# Patient Record
Sex: Female | Born: 2000 | Race: White | Hispanic: No | Marital: Single | Smoking: Current every day smoker
Health system: Southern US, Community
[De-identification: ages and names within clinical notes are randomized; demographics above are authoritative.]

## PROBLEM LIST (undated history)

## (undated) DIAGNOSIS — R569 Unspecified convulsions: Secondary | ICD-10-CM

## (undated) DIAGNOSIS — F32A Depression, unspecified: Secondary | ICD-10-CM

## (undated) DIAGNOSIS — K219 Gastro-esophageal reflux disease without esophagitis: Secondary | ICD-10-CM

## (undated) DIAGNOSIS — H6690 Otitis media, unspecified, unspecified ear: Secondary | ICD-10-CM

## (undated) DIAGNOSIS — F4310 Post-traumatic stress disorder, unspecified: Secondary | ICD-10-CM

## (undated) DIAGNOSIS — F909 Attention-deficit hyperactivity disorder, unspecified type: Secondary | ICD-10-CM

## (undated) DIAGNOSIS — E669 Obesity, unspecified: Secondary | ICD-10-CM

## (undated) DIAGNOSIS — F329 Major depressive disorder, single episode, unspecified: Secondary | ICD-10-CM

## (undated) DIAGNOSIS — F419 Anxiety disorder, unspecified: Secondary | ICD-10-CM

## (undated) HISTORY — PX: NO PAST SURGERIES: SHX2092

---
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
## 2021-03-25 DIAGNOSIS — F1990 Other psychoactive substance use, unspecified, uncomplicated: Secondary | ICD-10-CM | POA: Insufficient documentation

## 2021-03-27 ENCOUNTER — Ambulatory Visit: Payer: Self-pay

## 2021-09-30 ENCOUNTER — Telehealth: Payer: Self-pay

## 2021-10-05 NOTE — Telephone Encounter (Signed)
OUD Intake:  Drug of choice: Heroin  How long: 2.5 years  Other substances: No  Currently on suboxone: No  Interested in treatment: Yes; two years ago she was working with Psychiatrist to quit but then that Provider went out-of-network  Around users.: Occasionally  Social support: Yes, mom and grandparents   Other: NA  Will forward to OUD Attending Pool for consideration of OUD appt. Kinnie Feil, BSN, RN-BC

## 2021-10-05 NOTE — Telephone Encounter (Signed)
Sounds appropriate for OUD appointment

## 2021-10-05 NOTE — Telephone Encounter (Signed)
Have tried 3 different numbers to schedule patient for OUD appt. No answer and no VM set up.

## 2022-05-10 ENCOUNTER — Ambulatory Visit: Payer: Self-pay

## 2022-07-14 ENCOUNTER — Ambulatory Visit
Admission: EM | Admit: 2022-07-14 | Discharge: 2022-07-14 | Payer: Medicaid Other | Attending: Physician Assistant | Admitting: Physician Assistant

## 2022-07-14 ENCOUNTER — Emergency Department
Admission: EM | Admit: 2022-07-14 | Discharge: 2022-07-14 | Disposition: A | Discharge status: Home / Self Care | Payer: Medicaid Other | Attending: Emergency Medicine | Admitting: Emergency Medicine

## 2022-07-14 ENCOUNTER — Telehealth: Payer: Self-pay | Admitting: Physician Assistant

## 2022-07-14 ENCOUNTER — Emergency Department: Payer: Medicaid Other

## 2022-07-14 ENCOUNTER — Other Ambulatory Visit: Payer: Self-pay

## 2022-07-14 DIAGNOSIS — K529 Noninfective gastroenteritis and colitis, unspecified: Secondary | ICD-10-CM

## 2022-07-14 DIAGNOSIS — N3090 Cystitis, unspecified without hematuria: Secondary | ICD-10-CM | POA: Diagnosis not present

## 2022-07-14 DIAGNOSIS — R82998 Other abnormal findings in urine: Secondary | ICD-10-CM | POA: Insufficient documentation

## 2022-07-14 DIAGNOSIS — R1011 Right upper quadrant pain: Secondary | ICD-10-CM

## 2022-07-14 DIAGNOSIS — N390 Urinary tract infection, site not specified: Secondary | ICD-10-CM

## 2022-07-14 DIAGNOSIS — A599 Trichomoniasis, unspecified: Secondary | ICD-10-CM

## 2022-07-14 LAB — URINALYSIS, ROUTINE W REFLEX MICROSCOPIC
Bilirubin Urine: NEGATIVE
Glucose, UA: NEGATIVE mg/dL
Hgb urine dipstick: NEGATIVE
Ketones, ur: NEGATIVE mg/dL
Nitrite: NEGATIVE
Protein, ur: >=300 mg/dL — AB
Specific Gravity, Urine: 1.043 — ABNORMAL HIGH (ref 1.005–1.030)
WBC, UA: >50 WBC/hpf (ref 0–5)
pH: 5 (ref 5.0–8.0)

## 2022-07-14 LAB — COMPREHENSIVE METABOLIC PANEL
ALT: 13 U/L (ref 0–44)
AST: 17 U/L (ref 15–41)
Albumin: 3.8 g/dL (ref 3.5–5.0)
Alkaline Phosphatase: 99 U/L (ref 38–126)
Anion gap: 11 (ref 5–15)
BUN: 15 mg/dL (ref 6–20)
CO2: 27 mmol/L (ref 22–32)
Calcium: 9.6 mg/dL (ref 8.9–10.3)
Chloride: 99 mmol/L (ref 98–111)
Creatinine, Ser: 0.72 mg/dL (ref 0.44–1.00)
GFR, Estimated: >60 mL/min (ref >60)
Glucose, Bld: 128 mg/dL — ABNORMAL HIGH (ref 70–99)
Potassium: 3.8 mmol/L (ref 3.5–5.1)
Sodium: 137 mmol/L (ref 135–145)
Total Bilirubin: 0.6 mg/dL (ref 0.3–1.2)
Total Protein: 8.9 g/dL — ABNORMAL HIGH (ref 6.5–8.1)

## 2022-07-14 LAB — URINALYSIS, W/ REFLEX TO CULTURE (INFECTION SUSPECTED)
Glucose, UA: NEGATIVE mg/dL
Hgb urine dipstick: NEGATIVE
Ketones, ur: 15 mg/dL — AB
Leukocytes,Ua: NEGATIVE
Nitrite: POSITIVE — AB
Protein, ur: 100 mg/dL — AB
Specific Gravity, Urine: >1.03 — ABNORMAL HIGH (ref 1.005–1.030)
pH: 6 (ref 5.0–8.0)

## 2022-07-14 LAB — CBC
HCT: 37.8 % (ref 36.0–46.0)
Hemoglobin: 12.4 g/dL (ref 12.0–15.0)
MCH: 27.5 pg (ref 26.0–34.0)
MCHC: 32.8 g/dL (ref 30.0–36.0)
MCV: 83.8 fL (ref 80.0–100.0)
Platelets: 259 10*3/uL (ref 150–400)
RBC: 4.51 MIL/uL (ref 3.87–5.11)
RDW: 13.5 % (ref 11.5–15.5)
WBC: 23 10*3/uL — ABNORMAL HIGH (ref 4.0–10.5)
nRBC: 0 % (ref 0.0–0.2)

## 2022-07-14 LAB — POC URINE PREG, ED: Preg Test, Ur: NEGATIVE

## 2022-07-14 LAB — LIPASE, BLOOD: Lipase: 32 U/L (ref 11–51)

## 2022-07-14 LAB — LACTIC ACID, PLASMA: Lactic Acid, Venous: 1.4 mmol/L (ref 0.5–1.9)

## 2022-07-14 MED ORDER — METRONIDAZOLE 500 MG PO TABS: 500.0000 mg | ORAL_TABLET | Freq: Two times a day (BID) | ORAL | 0 refills | Status: AC

## 2022-07-14 MED ORDER — ONDANSETRON 4 MG PO TBDP: 4.0000 mg | ORAL_TABLET | Freq: Three times a day (TID) | ORAL | 0 refills | Status: AC | PRN

## 2022-07-14 MED ORDER — LACTATED RINGERS IV BOLUS
1000.0000 mL | Freq: Once | INTRAVENOUS | Status: AC
  Administered 2022-07-14: 1000 mL via INTRAVENOUS

## 2022-07-14 MED ORDER — CIPROFLOXACIN HCL 500 MG PO TABS: 500.0000 mg | ORAL_TABLET | Freq: Two times a day (BID) | ORAL | 0 refills | Status: AC

## 2022-07-14 MED ORDER — IOHEXOL 300 MG/ML  SOLN
100.0000 mL | Freq: Once | INTRAMUSCULAR | Status: AC | PRN
  Administered 2022-07-14: 100 mL via INTRAVENOUS

## 2022-07-14 MED ORDER — HYDROMORPHONE HCL 1 MG/ML IJ SOLN
1.0000 mg | Freq: Once | INTRAMUSCULAR | Status: AC
  Administered 2022-07-14: 1 mg via INTRAVENOUS
  Filled 2022-07-14: qty 1

## 2022-07-14 MED ORDER — CIPROFLOXACIN HCL 500 MG PO TABS
500.0000 mg | ORAL_TABLET | Freq: Once | ORAL | Status: AC
  Administered 2022-07-14: 500 mg via ORAL
  Filled 2022-07-14: qty 1

## 2022-07-14 MED ORDER — ONDANSETRON HCL 4 MG/2ML IJ SOLN
4.0000 mg | Freq: Once | INTRAMUSCULAR | Status: AC
  Administered 2022-07-14: 4 mg via INTRAVENOUS
  Filled 2022-07-14: qty 2

## 2022-07-14 NOTE — Discharge Instructions (Addendum)
Your CAT scan showed colitis and you may have a UTI.  Please take the antibiotic twice a day for the next 5 days.  You can take the Zofran as needed for nausea and vomiting.  If you develop worsening abdominal pain or not able to keep fluids down please return to the emergency department.

## 2022-07-14 NOTE — Telephone Encounter (Signed)
Urinalysis showed trichomonas.  I attempted to call patient using a couple different numbers but she did not answer.  Patient was sent to the emergency department from here for suspicion of cholecystitis/cholelithiasis or pancreatitis.  She is at [facility] at this time in the emergency department.  I sent her a message through [place].  Sent metronidazole to pharmacy.

## 2022-07-14 NOTE — ED Triage Notes (Signed)
Pt c/o sharp pains in right side that shoot up to the chest. PT states that she when she has pain she can not breath well due to the pain.  Pt is having Dark brown urine.   Pt denies vaginal odor or discharge.  Pt has PCOS

## 2022-07-14 NOTE — Discharge Instructions (Signed)
-  I have significant concerns about gallstones and potential gallbladder infection or pancreatitis.  Advised to go immediately to the emergency department at this time.  Do not delay go at this time.  A few week condition could get worse. You will need imaging and labs.  If you do have a problem with your gallbladder it may need to be removed.  You have been advised to follow up immediately in the emergency department for concerning signs.symptoms. If you declined EMS transport, please have a family member take you directly to the ED at this time. Do not delay. Based on concerns about condition, if you do not follow up in th e ED, you may risk poor outcomes including worsening of condition, delayed treatment and potentially life threatening issues. If you have declined to go to the ED at this time, you should call your PCP immediately to set up a follow up appointment.  Go to ED for red flag symptoms, including; fevers you cannot reduce with Tylenol/Motrin, severe headaches, vision changes, numbness/weakness in part of the body, lethargy, confusion, intractable vomiting, severe dehydration, chest pain, breathing difficulty, severe persistent abdominal or pelvic pain, signs of severe infection (increased redness, swelling of an area), feeling faint or passing out, dizziness, etc. You should especially go to the ED for sudden acute worsening of condition if you do not elect to go at this time.

## 2022-07-14 NOTE — ED Provider Notes (Signed)
[facility]    CSN: [id number] Arrival date & time: 07/14/22  1355      History   Chief Complaint Chief Complaint  Patient presents with   Urinary Tract Infection    HPI Stacey Mayo is a 22 y.o. female presenting for 2-day history of right upper quadrant abdominal pain with occasional radiation into the chest.  Reports associated nausea without vomiting.  Denies fever.  Reports dark brown urine.  Denies flank pain, urinary frequency, urgency or dysuria.  No report of any vaginal discharge or odor.  Patient says pain has worsened today compared to yesterday.  She says her pain is severe at this time.  She denies cough, congestion, breathing difficulty.  Reports increased pain in her upper abdomen when she takes a deep breath.  No improvement with OTC meds.  No other concerns.  HPI  Past Medical History:  Diagnosis Date   ADHD (attention deficit hyperactivity disorder)    Anxiety    Depression    Ear infection    GERD (gastroesophageal reflux disease)    NO MEDS   Obesity    PTSD (post-traumatic stress disorder)    Seizures ([place])    febrile at age 31 x1    Patient Active Problem List   Diagnosis Date Noted   PCOS (polycystic ovarian syndrome) 01/17/2017   Acute cystitis without hematuria 01/17/2017   Pelvic pain 01/17/2017    Past Surgical History:  Procedure Laterality Date   LAPAROSCOPY N/A 07/25/2015   Procedure: LAPAROSCOPY DIAGNOSTIC;  Surgeon: Honor Loh Ward, MD;  Location: [facility] ORS;  Service: Gynecology;  Laterality: N/A;   NO PAST SURGERIES      OB History     Gravida  0   Para  0   Term  0   Preterm  0   AB  0   Living  0      SAB  0   IAB  0   Ectopic  0   Multiple  0   Live Births  0            Home Medications    Prior to Admission medications   Not on File    Family History Family History  Problem Relation Age of Onset   Hypertension Maternal Grandmother    Diabetes Maternal Grandfather    Diabetes  Paternal Grandmother    Diabetes Maternal Uncle     Social History Social History   Tobacco Use   Smoking status: Every Day    Packs/day: 1.00    Years: 1.00    Additional pack years: 0.00    Total pack years: 1.00    Types: Cigarettes   Smokeless tobacco: Never  Vaping Use   Vaping Use: Never used  Substance Use Topics   Alcohol use: No   Drug use: No     Allergies   Lamictal [lamotrigine]   Review of Systems Review of Systems  Constitutional:  Positive for appetite change. Negative for chills, fatigue and fever.  HENT:  Negative for congestion.   Respiratory:  Negative for cough and shortness of breath.   Cardiovascular:  Positive for chest pain.  Gastrointestinal:  Positive for abdominal pain and nausea. Negative for blood in stool, constipation, diarrhea, rectal pain and vomiting.  Genitourinary:  Negative for decreased urine volume, difficulty urinating, dysuria, flank pain, frequency, hematuria, pelvic pain, urgency, vaginal bleeding, vaginal discharge and vaginal pain.       +dark urine  Musculoskeletal:  Negative  for back pain.  Skin:  Negative for rash.  Neurological:  Negative for weakness.     Physical Exam Triage Vital Signs ED Triage Vitals  Enc Vitals Group     BP      Pulse      Resp      Temp      Temp src      SpO2      Weight      Height      Head Circumference      Peak Flow      Pain Score      Pain Loc      Pain Edu?      Excl. in [place]?    No data found.  Updated Vital Signs BP (!) 120/90 (BP Location: Left Arm)   Pulse (!) 122   Temp 98.5 F (36.9 C) (Oral)   Ht 5\' 4"  (1.626 m)   Wt 180 lb (81.6 kg)   LMP 01/13/2022   SpO2 100%   BMI 30.90 kg/m   Physical Exam Vitals and nursing note reviewed.  Constitutional:      General: She is not in acute distress.    Appearance: Normal appearance. She is not ill-appearing or toxic-appearing.  HENT:     Head: Normocephalic and atraumatic.  Eyes:     General: No scleral  icterus.       Right eye: No discharge.        Left eye: No discharge.     Conjunctiva/sclera: Conjunctivae normal.  Cardiovascular:     Rate and Rhythm: Regular rhythm. Tachycardia present.     Heart sounds: Normal heart sounds.  Pulmonary:     Effort: Pulmonary effort is normal. No respiratory distress.     Breath sounds: Normal breath sounds.  Abdominal:     Palpations: Abdomen is soft.     Tenderness: There is abdominal tenderness in the right upper quadrant and epigastric area. There is right CVA tenderness, left CVA tenderness and guarding (RUQ).  Musculoskeletal:     Cervical back: Neck supple.  Skin:    General: Skin is dry.  Neurological:     General: No focal deficit present.     Mental Status: She is alert. Mental status is at baseline.     Motor: No weakness.     Gait: Gait normal.  Psychiatric:        Mood and Affect: Mood normal.        Behavior: Behavior normal.        Thought Content: Thought content normal.      UC Treatments / Results  Labs (all labs ordered are listed, but only abnormal results are displayed) Labs Reviewed  URINALYSIS, W/ REFLEX TO CULTURE (INFECTION SUSPECTED) - Abnormal; Notable for the following components:      Result Value   Color, Urine AMBER (*)    Specific Gravity, Urine >1.030 (*)    Bilirubin Urine MODERATE (*)    Ketones, ur 15 (*)    Protein, ur 100 (*)    Nitrite POSITIVE (*)    Bacteria, UA MANY (*)    Trichomonas, UA PRESENT (*)    All other components within normal limits    EKG   Radiology No results found.  Procedures Procedures (including critical care time)  Medications Ordered in UC Medications - No data to display  Initial Impression / Assessment and Plan / UC Course  I have reviewed the triage vital signs and the nursing notes.  Pertinent labs & imaging results that were available during my care of the patient were reviewed by me and considered in my medical decision making (see chart for  details).   22 year old female presents for right upper quadrant abdominal pain, dark-colored urine, nausea, reduced appetite that began 2 days ago.  Symptoms worsening.  Worsening abdominal pain.  No associated fever.  Increased pain with taking a deep breath and touching upper abdomen.  Denies similar problems in the past.  She is afebrile.  She is not ill-appearing but she is very uncomfortable appearing and continues to hold her upper abdomen and pain.  Tearful at times.  On exam she is tachycardic with 122 bpm.  Abdomen soft and tenderness palpation of the right upper quadrant region as well as epigastric area with guarding of the right upper quadrant and positive Murphy sign.  Positive CVA tenderness bilaterally.  Chest is clear.  Urine obtained today is very dark in coloration.  Greater than 1.030 specific gravity, moderate bili, positive protein, ketones and nitrites but negative leukocytes and hemoglobin.  High suspicion for cholecystitis/cholelithiasis/pancreatitis/choledocholithiasis possibility.  Also consider hepatitis and other liver conditions given dark urine and right upper quadrant abdominal pain.  Advised patient emergent follow-up in ER at this time.  She says that her mom or grandma will take her.  Leaving in stable condition.  The lab informs me that trichomonas was seen on her microscopic analysis of the urine.  Attempted to call patient 3/20, but no answer.  Sent a message through [place] to inform her that she has sexually transmitted infection and sent metronidazole to pharmacy.  Attempted to call patient again using all 4 numbers on file on 07/15/2022 but no answer.   Final Clinical Impressions(s) / UC Diagnoses   Final diagnoses:  Abdominal pain, right upper quadrant  Dark brown urine     Discharge Instructions      -I have significant concerns about gallstones and potential gallbladder infection or pancreatitis.  Advised to go immediately to the emergency  department at this time.  Do not delay go at this time.  A few week condition could get worse. You will need imaging and labs.  If you do have a problem with your gallbladder it may need to be removed.  You have been advised to follow up immediately in the emergency department for concerning signs.symptoms. If you declined EMS transport, please have a family member take you directly to the ED at this time. Do not delay. Based on concerns about condition, if you do not follow up in th e ED, you may risk poor outcomes including worsening of condition, delayed treatment and potentially life threatening issues. If you have declined to go to the ED at this time, you should call your PCP immediately to set up a follow up appointment.  Go to ED for red flag symptoms, including; fevers you cannot reduce with Tylenol/Motrin, severe headaches, vision changes, numbness/weakness in part of the body, lethargy, confusion, intractable vomiting, severe dehydration, chest pain, breathing difficulty, severe persistent abdominal or pelvic pain, signs of severe infection (increased redness, swelling of an area), feeling faint or passing out, dizziness, etc. You should especially go to the ED for sudden acute worsening of condition if you do not elect to go at this time.      ED Prescriptions   None    PDMP not reviewed this encounter.   Danton Clap, PA-C 07/15/22 1400

## 2022-07-14 NOTE — ED Provider Notes (Addendum)
[facility] Provider Note    Event Date/Time   First MD Initiated Contact with Patient 07/14/22 1746     (approximate)   History   Abdominal Pain   HPI  Stacey Mayo is a 22 y.o. female past medical history of anxiety depression GERD PTSD seizure disorder who presents because of abdominal pain.  Symptoms started 2 days ago.  Initially had nausea vomiting and diarrhea.  That is since subsided.  She however continues to have right upper quadrant and epigastric pain.  Pain is worse with movement.  Denies fevers or chills.  Denies urinary symptoms or back pain.  Does feel worse when she takes a deep breath, denies cough.  No history of similar.  Does still have her gallbladder     Past Medical History:  Diagnosis Date   ADHD (attention deficit hyperactivity disorder)    Anxiety    Depression    Ear infection    GERD (gastroesophageal reflux disease)    NO MEDS   Obesity    PTSD (post-traumatic stress disorder)    Seizures (HCC)    febrile at age 42 x1    Patient Active Problem List   Diagnosis Date Noted   PCOS (polycystic ovarian syndrome) 01/17/2017   Acute cystitis without hematuria 01/17/2017   Pelvic pain 01/17/2017     Physical Exam  Triage Vital Signs: ED Triage Vitals  Enc Vitals Group     BP 07/14/22 1625 (!) 140/104     Pulse Rate 07/14/22 1625 (!) 129     Resp 07/14/22 1625 18     Temp 07/14/22 1625 98.6 F (37 C)     Temp Source 07/14/22 1625 Oral     SpO2 07/14/22 1625 100 %     Weight --      Height --      Head Circumference --      Peak Flow --      Pain Score 07/14/22 1626 10     Pain Loc --      Pain Edu? --      Excl. in [place]? --     Most recent vital signs: Vitals:   07/14/22 2000 07/14/22 2030  BP: (!) 130/100 101/67  Pulse: (!) 104 (!) 105  Resp: 16 16  Temp: 98 F (36.7 C)   SpO2: 100% 100%     General: Awake, patient is uncomfortable appearing  CV:  Good peripheral perfusion.  Resp:  Normal effort.   Abd:  No distention.  Patient is intermittently wincing in pain throughout the exam even when I not palpate her abdomen, she does guard and has pain in the right lower right upper quadrant and epigastric region Neuro:             Awake, Alert, Oriented x 3  Other:     ED Results / Procedures / Treatments  Labs (all labs ordered are listed, but only abnormal results are displayed) Labs Reviewed  COMPREHENSIVE METABOLIC PANEL - Abnormal; Notable for the following components:      Result Value   Glucose, Bld 128 (*)    Total Protein 8.9 (*)    All other components within normal limits  CBC - Abnormal; Notable for the following components:   WBC 23.0 (*)    All other components within normal limits  URINALYSIS, ROUTINE W REFLEX MICROSCOPIC - Abnormal; Notable for the following components:   Color, Urine YELLOW (*)    APPearance TURBID (*)  Specific Gravity, Urine 1.043 (*)    Protein, ur >=300 (*)    Leukocytes,Ua MODERATE (*)    Bacteria, UA MANY (*)    All other components within normal limits  LIPASE, BLOOD  LACTIC ACID, PLASMA  LACTIC ACID, PLASMA  POC URINE PREG, ED     EKG     RADIOLOGY    PROCEDURES:  Critical Care performed: No  Procedures  The patient is on the cardiac monitor to evaluate for evidence of arrhythmia and/or significant heart rate changes.   MEDICATIONS ORDERED IN ED: Medications  ciprofloxacin (CIPRO) tablet 500 mg (has no administration in time range)  HYDROmorphone (DILAUDID) injection 1 mg (1 mg Intravenous Given 07/14/22 1956)  ondansetron (ZOFRAN) injection 4 mg (4 mg Intravenous Given 07/14/22 1957)  lactated ringers bolus 1,000 mL (1,000 mLs Intravenous New Bag/Given 07/14/22 1957)  iohexol (OMNIPAQUE) 300 MG/ML solution 100 mL (100 mLs Intravenous Contrast Given 07/14/22 2013)     IMPRESSION / MDM / ASSESSMENT AND PLAN / ED COURSE  I reviewed the triage vital signs and the nursing notes.                               Patient's presentation is most consistent with acute complicated illness / injury requiring diagnostic workup.  Differential diagnosis includes, but is not limited to, acute cholecystitis, pancreatitis, hepatitis, hepatic abscess, appendicitis, pyelonephritis  Patient is a 22 year old female presents with right upper quadrant abdominal pain.  Symptoms started 2 days ago and initially were associated vomiting and diarrhea which is since subsided.  She has pain located in the right upper abdomen and epigastric region.  On arrival she is tachycardic she looks uncomfortable she is wincing intermittently in the bed abdominal exam somewhat difficult to interpret as she has intermittent wincing even I am not palpating but she does seem to guard with palpation in the entire right side of the abdomen including right lower and right upper quadrant.  I did perform bedside screening ultrasound to evaluate the gallbladder to assess the likelihood that this is biliary and gallbladder looks grossly normal.  This will proceed with CT of the abdomen pelvis.  Will obtain labs including CMP lipase and CBC.  She has a leukocytosis to 23 normal LFTs and lipase.  Will give Dilaudid for pain fluid bolus and Zofran.  Will check lactate given her tachycardia and leukocytosis.  Patient's lactate is normal.  Heart rate did improve after fluid bolus.  CT of the abdomen pelvis is read as findings consistent with colitis.  Normal appendix.  Patient's UA has greater than 50 white cells there are 21-50 squames and she is not having urinary symptoms so question whether this is a true UTI but will cover with cipro for both GI and urinary coverage.  On reassessment patient is feeling improved.  P.o. has not been an issue over the last day.  Feel that she can be treated with outpatient antibiotics.  Did discuss return precautions.      FINAL CLINICAL IMPRESSION(S) / ED DIAGNOSES   Final diagnoses:  Colitis  Urinary tract  infection without hematuria, site unspecified     Rx / DC Orders   ED Discharge Orders          Ordered    ciprofloxacin (CIPRO) 500 MG tablet  2 times daily        07/14/22 2104    ondansetron (ZOFRAN-ODT) 4 MG disintegrating tablet  Every  8 hours PRN        07/14/22 2104             Note:  This document was prepared using Dragon voice recognition software and may include unintentional dictation errors.   Rada Hay, MD 07/14/22 2104    Rada Hay, MD 07/14/22 2107

## 2022-07-14 NOTE — ED Triage Notes (Signed)
Pt to ED via POV from home. Pt reports RUQ pain that shoots up to her chest. Pt seen at [facility] and told to come to ER for possible gallbladder issue. Pt is having dark brown urine.

## 2022-07-14 NOTE — ED Notes (Signed)
Patient is being discharged from the Urgent Care and sent to the Emergency Department via personal vehicle  . Per Christene Slates, PA, patient is in need of higher level of care due to severe right side abdominal pain. Patient is aware and verbalizes understanding of plan of care.  Vitals:   07/14/22 1440  BP: (!) 120/90  Pulse: (!) 122  Temp: 98.5 F (36.9 C)  SpO2: 100%

## 2022-07-16 LAB — URINE CULTURE

## 2022-08-09 ENCOUNTER — Ambulatory Visit: Payer: Self-pay

## 2022-08-11 ENCOUNTER — Ambulatory Visit: Payer: Medicaid Other

## 2022-09-01 ENCOUNTER — Ambulatory Visit: Payer: Medicaid Other

## 2022-09-01 ENCOUNTER — Ambulatory Visit: Payer: Self-pay

## 2022-09-05 ENCOUNTER — Ambulatory Visit: Payer: Medicaid Other

## 2022-09-05 DIAGNOSIS — F4310 Post-traumatic stress disorder, unspecified: Secondary | ICD-10-CM | POA: Insufficient documentation

## 2022-09-05 DIAGNOSIS — F909 Attention-deficit hyperactivity disorder, unspecified type: Secondary | ICD-10-CM | POA: Insufficient documentation

## 2022-09-05 DIAGNOSIS — F32A Depression, unspecified: Secondary | ICD-10-CM | POA: Insufficient documentation

## 2022-09-09 ENCOUNTER — Inpatient Hospital Stay: Admission: RE | Admit: 2022-09-09 | Payer: Medicaid Other | Source: Ambulatory Visit
